# Patient Record
Sex: Female | Born: 1940 | Race: Black or African American | Hispanic: No | Smoking: Never smoker
Health system: Southern US, Community
[De-identification: ages and names within clinical notes are randomized; demographics above are authoritative.]

---

## 2006-01-13 ENCOUNTER — Ambulatory Visit: Payer: Self-pay | Admitting: Nurse Practitioner

## 2007-10-11 ENCOUNTER — Ambulatory Visit: Payer: Self-pay | Admitting: Nurse Practitioner

## 2007-10-14 ENCOUNTER — Ambulatory Visit: Payer: Self-pay | Admitting: Nurse Practitioner

## 2008-10-15 ENCOUNTER — Ambulatory Visit: Payer: Self-pay | Admitting: Nurse Practitioner

## 2009-04-11 ENCOUNTER — Ambulatory Visit: Payer: Self-pay | Admitting: Nurse Practitioner

## 2009-04-11 IMAGING — US US CAROTID DUPLEX BILAT
1 series · 17 of 24 positions shown · non-contrast
Comparison: none

REASON FOR EXAM: new bruit left side
COMMENTS:

[Series 1: us carotid duplex bilat · 17 of 65 slices shown]
[im 1/65]
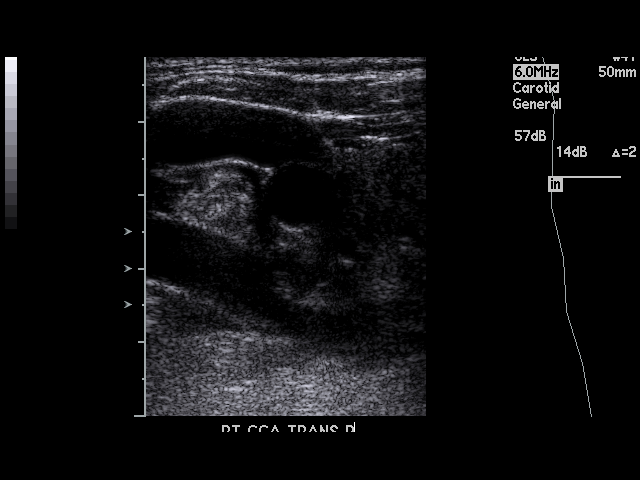
[im 6/65]
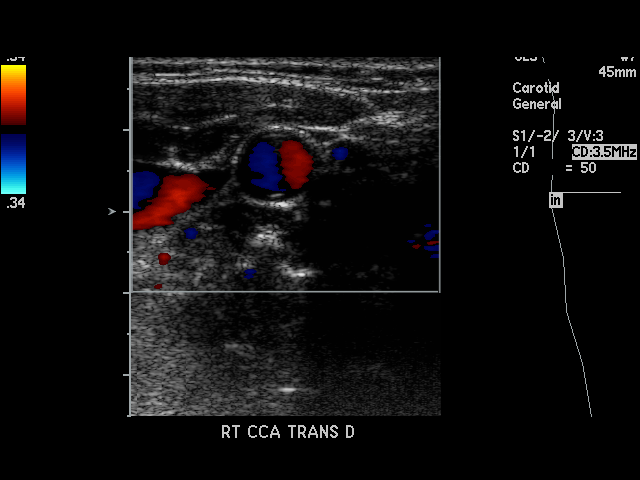
[im 9/65]
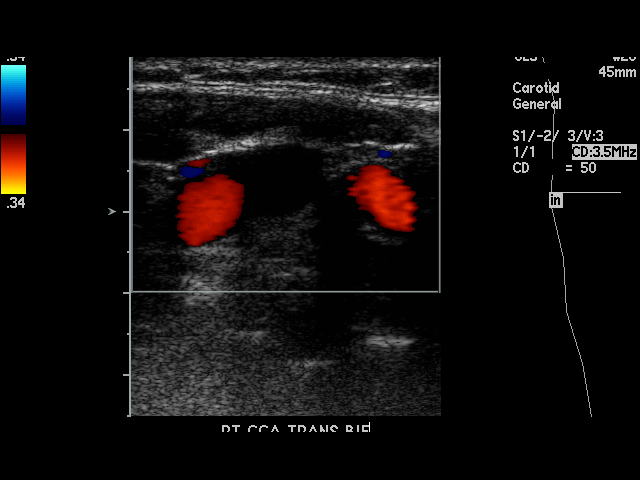
[im 12/65]
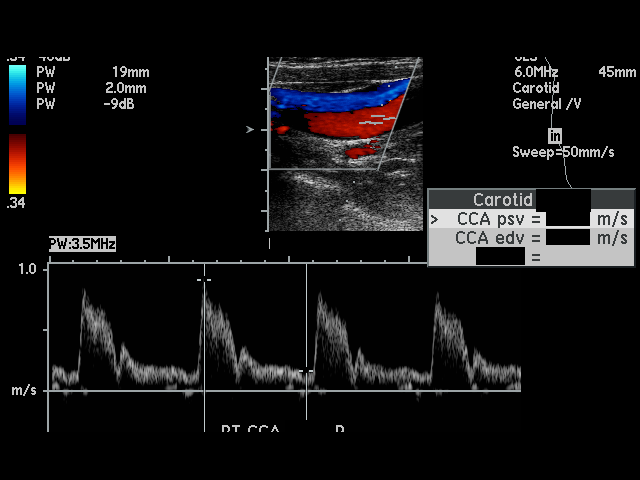
[im 17/65]
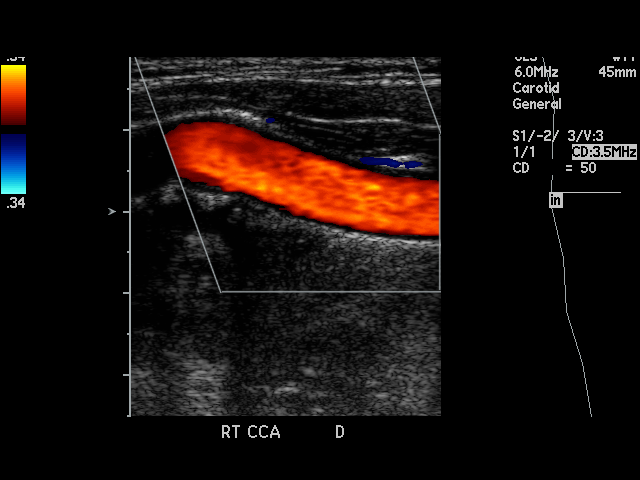
[im 20/65]
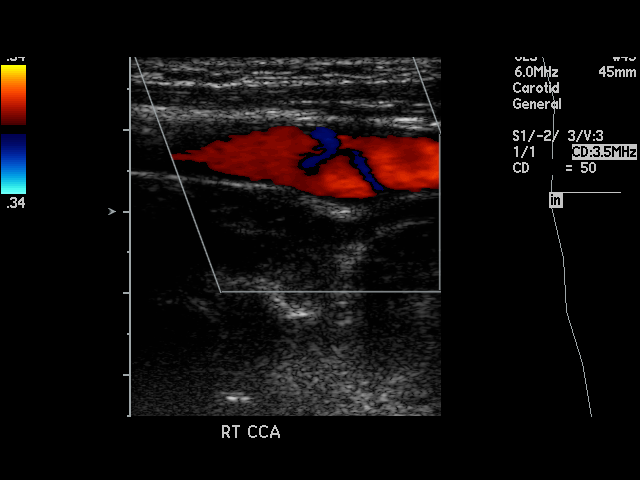
[im 26/65]
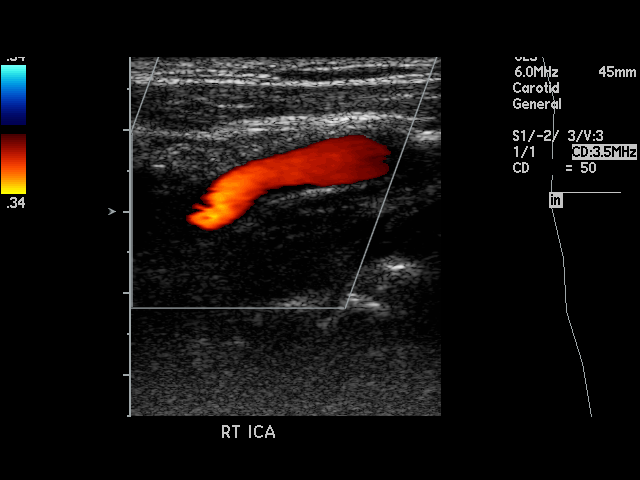
[im 28/65]
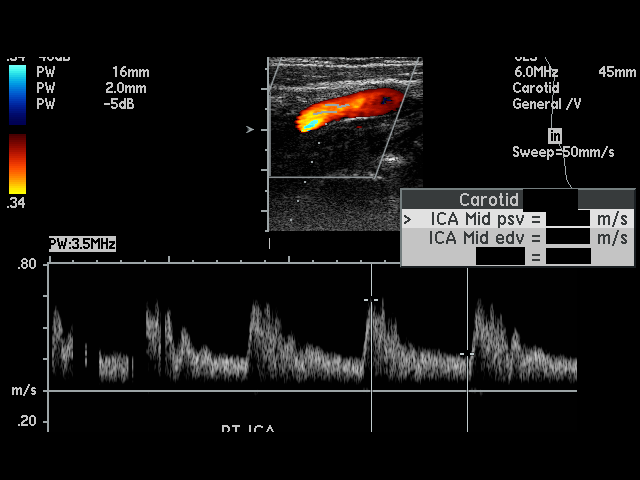
[im 34/65]
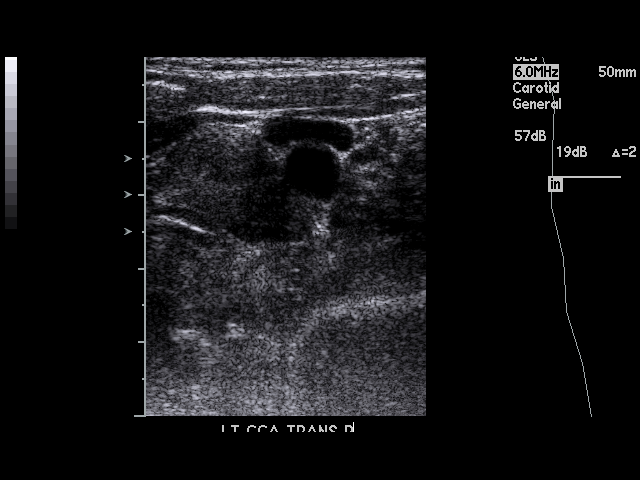
[im 37/65]
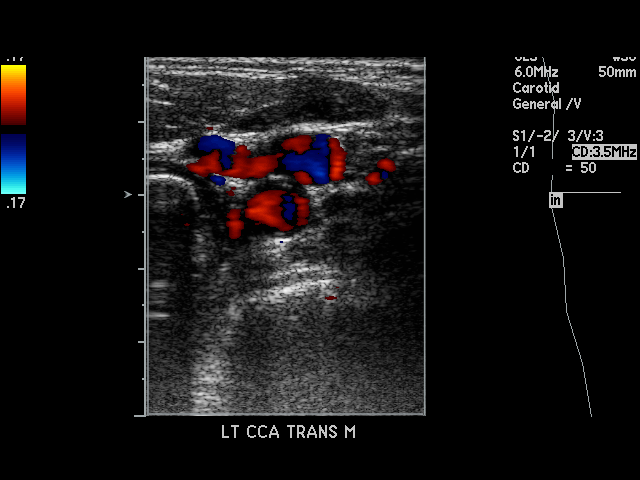
[im 39/65]
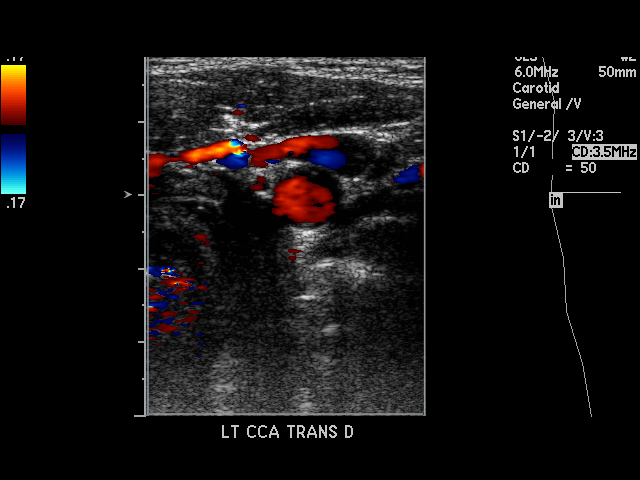
[im 45/65]
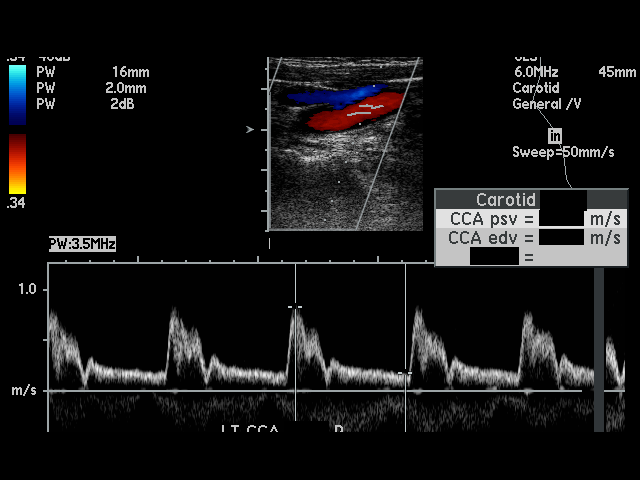
[im 48/65]
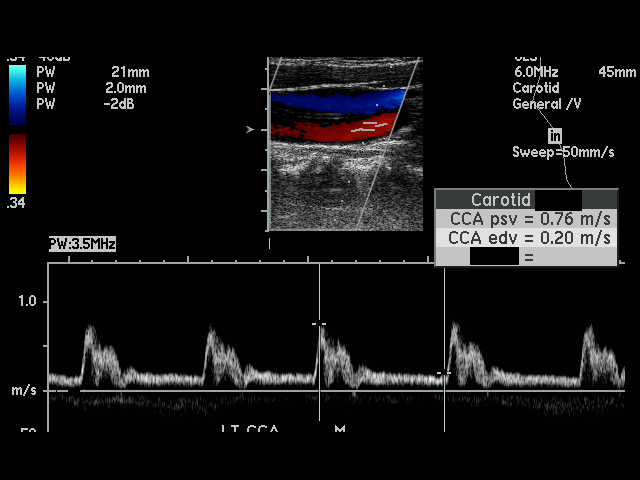
[im 53/65]
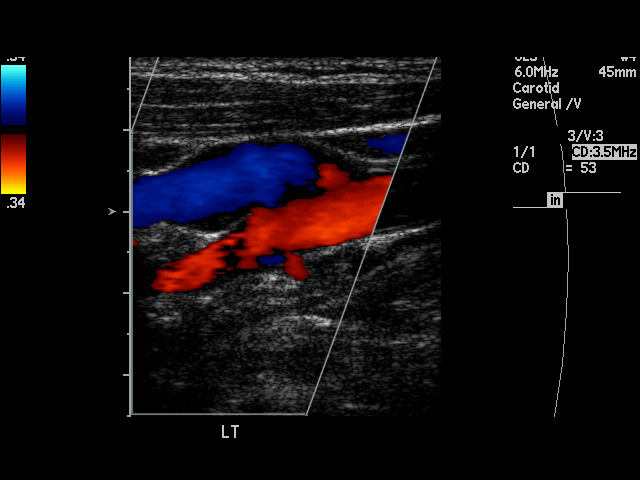
[im 56/65]
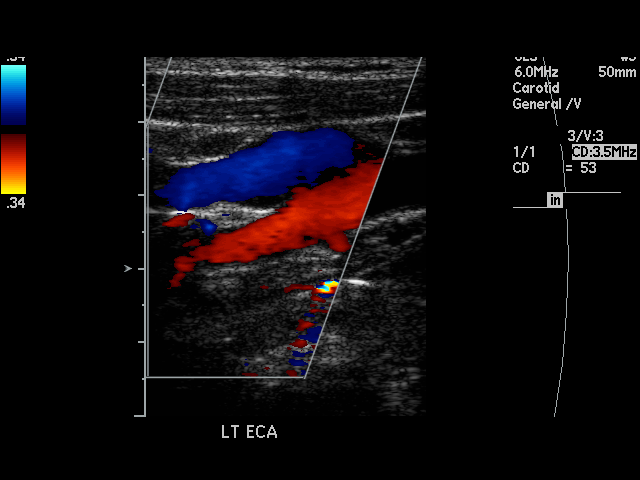
[im 59/65]
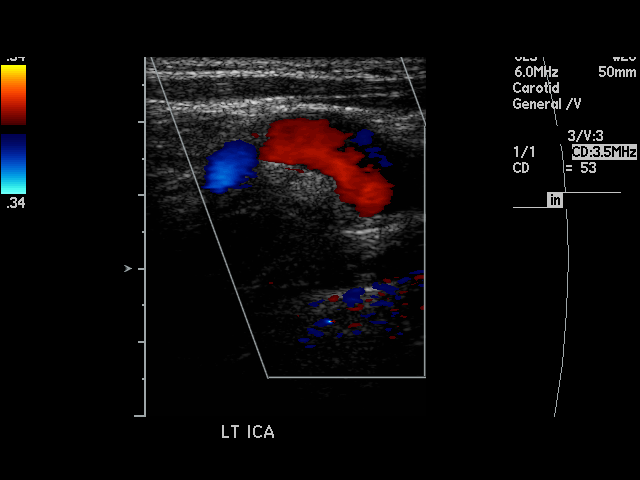
[im 65/65]
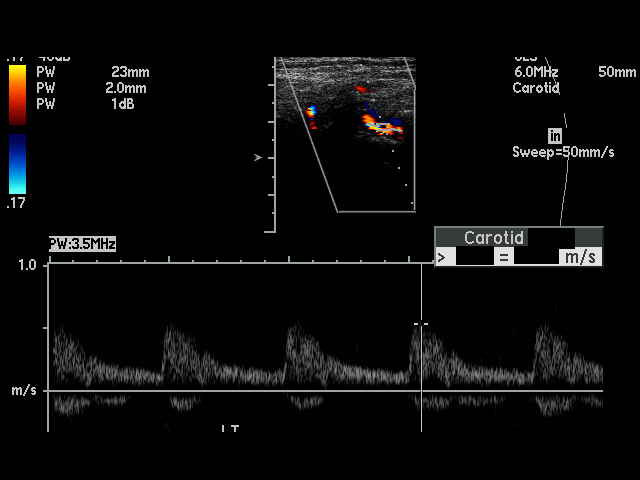

[17 of 24 positions shown; findings below may reference images not displayed]

PROCEDURE:     US  - US CAROTID DOPPLER BILATERAL  - [DATE] [DATE]

RESULT:     Neither the right nor left carotid systems exhibit calcified or
soft plaque. The waveform patterns are normal and the color flow images are
normal. On the right peak internal carotid systolic velocity measured 58 cm
per second and peak common carotid velocity measured 82 cm corresponding to
ratio of 0.7. On the left peak internal carotid systolic velocity measured
58 cm per second and peak common carotid velocity measured 58 cm per second
corresponding to ratio of one. The vertebral arteries are normal in flow
direction bilaterally.
IMPRESSION: Normal carotid Doppler ultrasound examination.

## 2010-09-02 ENCOUNTER — Ambulatory Visit: Payer: Self-pay | Admitting: Nurse Practitioner

## 2012-08-03 ENCOUNTER — Ambulatory Visit: Payer: Self-pay | Admitting: Nurse Practitioner

## 2013-09-13 ENCOUNTER — Ambulatory Visit: Payer: Self-pay | Admitting: Podiatry

## 2013-09-27 ENCOUNTER — Ambulatory Visit: Payer: Self-pay | Admitting: Podiatry

## 2013-11-02 ENCOUNTER — Ambulatory Visit: Payer: Self-pay | Admitting: Family Medicine

## 2017-11-04 ENCOUNTER — Ambulatory Visit: Payer: Self-pay | Admitting: Podiatry

## 2017-12-13 ENCOUNTER — Ambulatory Visit: Payer: Medicare HMO | Admitting: Podiatry

## 2017-12-13 ENCOUNTER — Encounter: Payer: Self-pay | Admitting: Podiatry

## 2017-12-13 VITALS — BP 173/87 | HR 71

## 2017-12-13 DIAGNOSIS — L84 Corns and callosities: Secondary | ICD-10-CM | POA: Diagnosis not present

## 2017-12-13 DIAGNOSIS — M2042 Other hammer toe(s) (acquired), left foot: Secondary | ICD-10-CM

## 2017-12-13 DIAGNOSIS — E119 Type 2 diabetes mellitus without complications: Secondary | ICD-10-CM | POA: Diagnosis not present

## 2017-12-13 DIAGNOSIS — M2041 Other hammer toe(s) (acquired), right foot: Secondary | ICD-10-CM | POA: Diagnosis not present

## 2017-12-13 NOTE — Progress Notes (Signed)
This patient presents to the office with chief complaint of painful corn fourth toe right footand diabetic feet.  This patient  says there  is  pain and discomfort in her fourth toe right foot..  .  This callus  are painful walking and wearing shoes.  Patient has no history of infection or drainage from both feet.  Patient is unable to  self treat.  She has applied acid to treat her fourth toe corn right foot. . This patient presents  to the office today for treatment of painful corn  and a foot evaluation due to history of  diabetes.  General Appearance  Alert, conversant and in no acute stress.  Vascular  Dorsalis pedis and posterior tibial  pulses are palpable  bilaterally.  Capillary return is within normal limits  bilaterally. Temperature is within normal limits  bilaterally.  Neurologic  Senn-Weinstein monofilament wire test within normal limits  bilaterally. Muscle power within normal limits bilaterally.  Nails Thick disfigured discolored nails with subungual debris  from hallux to fifth toes bilaterally. No evidence of bacterial infection or drainage bilaterally.  Orthopedic  No limitations of motion of motion feet .  No crepitus or effusions noted.  Hammer toes 2-4  /L.  Skin  normotropic skin with no porokeratosis noted bilaterally.  No signs of infections or ulcers noted.   [place] durum fourth toe right foot.  [place] durum fourth toe right foot.  Diabetes with no foot complications  IE  Debride corn fourth toe right..  A diabetic foot exam was performed and there is no evidence of any vascular or neurologic pathology.  Patient was told not to use acid in the future.  RTC 3 months.   Helane GuntherGregory Miliano Cotten DPM

## 2018-03-17 ENCOUNTER — Ambulatory Visit: Payer: Medicare HMO | Admitting: Podiatry
